# Patient Record
Sex: Male | Born: 1965 | Race: Black or African American | Hispanic: No | Marital: Married | Smoking: Current every day smoker
Health system: Southern US, Community
[De-identification: ages and names within clinical notes are randomized; demographics above are authoritative.]

## PROBLEM LIST (undated history)

## (undated) ENCOUNTER — Emergency Department: Discharge status: Absent without leave | Payer: Self-pay

---

## 2004-12-03 ENCOUNTER — Emergency Department: Payer: Self-pay | Admitting: Emergency Medicine

## 2005-04-20 ENCOUNTER — Emergency Department: Payer: Self-pay | Admitting: General Practice

## 2005-04-20 ENCOUNTER — Other Ambulatory Visit: Payer: Self-pay

## 2010-09-24 ENCOUNTER — Emergency Department: Payer: Self-pay | Admitting: Emergency Medicine

## 2012-03-23 ENCOUNTER — Emergency Department (HOSPITAL_COMMUNITY)
Admission: EM | Admit: 2012-03-23 | Discharge: 2012-03-23 | Disposition: A | Discharge status: Home / Self Care | Payer: Self-pay | Attending: Emergency Medicine | Admitting: Emergency Medicine

## 2012-03-23 ENCOUNTER — Encounter (HOSPITAL_COMMUNITY): Payer: Self-pay | Admitting: Emergency Medicine

## 2012-03-23 DIAGNOSIS — F17200 Nicotine dependence, unspecified, uncomplicated: Secondary | ICD-10-CM | POA: Insufficient documentation

## 2012-03-23 DIAGNOSIS — J029 Acute pharyngitis, unspecified: Secondary | ICD-10-CM | POA: Insufficient documentation

## 2012-03-23 DIAGNOSIS — R059 Cough, unspecified: Secondary | ICD-10-CM | POA: Insufficient documentation

## 2012-03-23 DIAGNOSIS — M545 Low back pain, unspecified: Secondary | ICD-10-CM | POA: Insufficient documentation

## 2012-03-23 DIAGNOSIS — Z79899 Other long term (current) drug therapy: Secondary | ICD-10-CM | POA: Insufficient documentation

## 2012-03-23 DIAGNOSIS — R05 Cough: Secondary | ICD-10-CM | POA: Insufficient documentation

## 2012-03-23 MED ORDER — AZITHROMYCIN 250 MG PO TABS: 250.0000 mg | ORAL_TABLET | Freq: Every day | ORAL | Status: AC

## 2012-03-23 MED ORDER — IBUPROFEN 600 MG PO TABS: 600.0000 mg | ORAL_TABLET | Freq: Three times a day (TID) | ORAL | Status: AC | PRN

## 2012-03-23 MED ORDER — IBUPROFEN 200 MG PO TABS
600.0000 mg | ORAL_TABLET | Freq: Once | ORAL | Status: AC
  Administered 2012-03-23: 600 mg via ORAL
  Filled 2012-03-23: qty 3

## 2012-03-23 MED ORDER — OXYCODONE-ACETAMINOPHEN 5-325 MG PO TABS
1.0000 | ORAL_TABLET | Freq: Once | ORAL | Status: AC
  Administered 2012-03-23: 1 via ORAL
  Filled 2012-03-23: qty 1

## 2012-03-23 NOTE — ED Provider Notes (Signed)
History     CSN: [id number]  Arrival date & time 03/23/12  0607   First MD Initiated Contact with Patient 03/23/12 0631      Chief Complaint  Patient presents with  . Sore Throat     The history is provided by the patient.   the patient has had coughing congestion and sore throat over the past 3 days.  He reports his cough is new.  His had fevers and chills.  He denies nausea and vomiting.  No abdominal pain.  His also had some low back pain associated with his coughing.  No urinary symptoms.  Symptoms are mild to moderate in severity.  He's tried some Alka-Seltzer without relief of his symptoms.  He does not have a primary care physician.  His pain in his throat is worsened by swallowing.  No lymphadenopathy noted by the patient.  No neck stiffness.  No headaches.  History reviewed. No pertinent past medical history.  History reviewed. No pertinent past surgical history.  History reviewed. No pertinent family history.  History  Substance Use Topics  . Smoking status: Current Every Day Smoker -- 1.0 packs/day    Types: Cigarettes  . Smokeless tobacco: Not on file  . Alcohol Use: Yes      Review of Systems  All other systems reviewed and are negative.    Allergies  Review of patient's allergies indicates not on file.  Home Medications   Current Outpatient Rx  Name  Route  Sig  Dispense  Refill  . AZITHROMYCIN 250 MG PO TABS   Oral   Take 1 tablet (250 mg total) by mouth daily. Take 2 tabs for first dose, then 1 tab for each additional dose   6 tablet   0   . IBUPROFEN 600 MG PO TABS   Oral   Take 1 tablet (600 mg total) by mouth every 8 (eight) hours as needed for pain.   15 tablet   0     BP 124/67  Pulse 105  Temp 99.7 F (37.6 C) (Oral)  Resp 18  SpO2 96%  Physical Exam  Nursing note and vitals reviewed. Constitutional: He is oriented to person, place, and time. He appears well-developed and well-nourished.  HENT:  Head: Normocephalic and  atraumatic.  Mouth/Throat: Uvula is midline and mucous membranes are normal. Normal dentition. No uvula swelling or dental caries. Posterior oropharyngeal erythema present. No oropharyngeal exudate or tonsillar abscesses.  Eyes: EOM are normal.  Neck: Normal range of motion.  Cardiovascular: Normal rate, regular rhythm, normal heart sounds and intact distal pulses.   Pulmonary/Chest: Effort normal and breath sounds normal. No respiratory distress.  Abdominal: Soft. He exhibits no distension. There is no tenderness.  Musculoskeletal: Normal range of motion.  Neurological: He is alert and oriented to person, place, and time.  Skin: Skin is warm and dry.  Psychiatric: He has a normal mood and affect. Judgment normal.    ED Course  Procedures (including critical care time)  Labs Reviewed - No data to display No results found.   1. Pharyngitis       MDM  Likely viral upper respiratory tract infections.  The patient is well-appearing.  he is nontoxic.  No hypoxia on exam.  Lung exam is clear.  Normal work of breathing.  No indication for chest x-ray.  Will cover for bronchitis, strep, atypical PNA with azithromycin         Lyanne Co, MD 03/23/12 [phone]

## 2012-03-23 NOTE — ED Notes (Signed)
Pt presents to the Ed with a complaint of a sore throat x 2 days.  Pt states that he has been coughing for the same amount of time.  Pt states" I have been having cold and hot sweats as well."

## 2012-03-23 NOTE — ED Notes (Signed)
Patient is alert and oriented x3.  He was given DC instructions and follow up visit instructions.  Patient gave verbal understanding.  He was DC ambulatory under his own power to home.  V/S stable.  He was not showing any signs of distress on DC 

## 2015-03-02 ENCOUNTER — Other Ambulatory Visit
Admission: RE | Admit: 2015-03-02 | Discharge: 2015-03-02 | Disposition: A | Discharge status: Home / Self Care | Attending: Family Medicine | Admitting: Family Medicine
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
## 2021-05-15 ENCOUNTER — Emergency Department
Admission: EM | Admit: 2021-05-15 | Discharge: 2021-05-15 | Disposition: A | Discharge status: Home / Self Care | Payer: No Typology Code available for payment source | Attending: Emergency Medicine | Admitting: Emergency Medicine

## 2021-05-15 ENCOUNTER — Emergency Department: Payer: No Typology Code available for payment source

## 2021-05-15 ENCOUNTER — Other Ambulatory Visit: Payer: Self-pay

## 2021-05-15 DIAGNOSIS — Y92410 Unspecified street and highway as the place of occurrence of the external cause: Secondary | ICD-10-CM | POA: Diagnosis not present

## 2021-05-15 DIAGNOSIS — M549 Dorsalgia, unspecified: Secondary | ICD-10-CM

## 2021-05-15 DIAGNOSIS — M542 Cervicalgia: Secondary | ICD-10-CM | POA: Insufficient documentation

## 2021-05-15 DIAGNOSIS — M545 Low back pain, unspecified: Secondary | ICD-10-CM | POA: Insufficient documentation

## 2021-05-15 DIAGNOSIS — S8991XA Unspecified injury of right lower leg, initial encounter: Secondary | ICD-10-CM | POA: Diagnosis present

## 2021-05-15 DIAGNOSIS — I10 Essential (primary) hypertension: Secondary | ICD-10-CM | POA: Insufficient documentation

## 2021-05-15 DIAGNOSIS — S8001XA Contusion of right knee, initial encounter: Secondary | ICD-10-CM | POA: Insufficient documentation

## 2021-05-15 MED ORDER — ACETAMINOPHEN 500 MG PO TABS
1000.0000 mg | ORAL_TABLET | Freq: Once | ORAL | Status: AC
  Administered 2021-05-15: 1000 mg via ORAL
  Filled 2021-05-15: qty 2

## 2021-05-15 MED ORDER — IBUPROFEN 400 MG PO TABS
400.0000 mg | ORAL_TABLET | Freq: Once | ORAL | Status: AC
  Administered 2021-05-15: 400 mg via ORAL
  Filled 2021-05-15: qty 1

## 2021-05-15 MED ORDER — LIDOCAINE 5 % EX PTCH
1.0000 | MEDICATED_PATCH | CUTANEOUS | Status: DC
  Administered 2021-05-15: 1 via TRANSDERMAL
  Filled 2021-05-15: qty 1

## 2021-05-15 MED ORDER — CYCLOBENZAPRINE HCL 10 MG PO TABS: 10.0000 mg | ORAL_TABLET | Freq: Three times a day (TID) | ORAL | 0 refills | Status: AC | PRN
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
